# Patient Record
Sex: Male | Born: 1970 | Race: Black or African American | Hispanic: No | Marital: Married | Smoking: Never smoker
Health system: Southern US, Community
[De-identification: ages and names within clinical notes are randomized; demographics above are authoritative.]

## PROBLEM LIST (undated history)

## (undated) HISTORY — PX: KNEE SURGERY: SHX244

---

## 2009-02-05 ENCOUNTER — Emergency Department (HOSPITAL_COMMUNITY): Admission: EM | Admit: 2009-02-05 | Discharge: 2009-02-05 | Payer: Self-pay | Admitting: Emergency Medicine

## 2009-02-08 ENCOUNTER — Encounter: Admission: RE | Admit: 2009-02-08 | Discharge: 2009-02-08 | Payer: Self-pay | Admitting: Orthopedic Surgery

## 2009-02-09 ENCOUNTER — Ambulatory Visit (HOSPITAL_COMMUNITY): Admission: RE | Admit: 2009-02-09 | Discharge: 2009-02-10 | Payer: Self-pay | Admitting: Orthopedic Surgery

## 2009-08-04 ENCOUNTER — Emergency Department (HOSPITAL_COMMUNITY): Admission: EM | Admit: 2009-08-04 | Discharge: 2009-08-04 | Payer: Self-pay | Admitting: Emergency Medicine

## 2010-08-19 LAB — POCT I-STAT, CHEM 8
BUN: 12 mg/dL (ref 6–23)
Chloride: 104 mEq/L (ref 96–112)
Creatinine, Ser: 1 mg/dL (ref 0.4–1.5)
Glucose, Bld: 90 mg/dL (ref 70–99)
Hemoglobin: 15 g/dL (ref 13.0–17.0)
Sodium: 138 mEq/L (ref 135–145)

## 2010-08-19 LAB — CBC
Hemoglobin: 14.1 g/dL (ref 13.0–17.0)
MCHC: 33.2 g/dL (ref 30.0–36.0)
MCV: 85.4 fL (ref 78.0–100.0)
Platelets: 333 10*3/uL (ref 150–400)
RDW: 15 % (ref 11.5–15.5)

## 2010-08-19 LAB — POCT CARDIAC MARKERS
CKMB, poc: <1 ng/mL — ABNORMAL LOW (ref 1.0–8.0)
Myoglobin, poc: 101 ng/mL (ref 12–200)
Myoglobin, poc: 71.6 ng/mL (ref 12–200)
Troponin i, poc: <0.05 ng/mL (ref 0.00–0.09)
Troponin i, poc: <0.05 ng/mL (ref 0.00–0.09)

## 2010-08-30 LAB — CBC
HCT: 44.9 % (ref 39.0–52.0)
Hemoglobin: 14.8 g/dL (ref 13.0–17.0)
RBC: 5.2 MIL/uL (ref 4.22–5.81)
RDW: 14.7 % (ref 11.5–15.5)

## 2010-08-30 LAB — URINALYSIS, ROUTINE W REFLEX MICROSCOPIC
Hgb urine dipstick: NEGATIVE
Ketones, ur: NEGATIVE mg/dL
Protein, ur: NEGATIVE mg/dL
Specific Gravity, Urine: 1.02 (ref 1.005–1.030)
pH: 7 (ref 5.0–8.0)

## 2010-08-30 LAB — BASIC METABOLIC PANEL
BUN: 9 mg/dL (ref 6–23)
Chloride: 107 mEq/L (ref 96–112)
Creatinine, Ser: 0.95 mg/dL (ref 0.4–1.5)
GFR calc Af Amer: >60 mL/min (ref >60)
Glucose, Bld: 99 mg/dL (ref 70–99)

## 2010-08-30 LAB — PROTIME-INR: Prothrombin Time: 11.9 seconds (ref 11.6–15.2)

## 2010-08-30 LAB — APTT: aPTT: 29 seconds (ref 24–37)

## 2010-08-30 LAB — DIFFERENTIAL
Monocytes Absolute: 0.6 10*3/uL (ref 0.1–1.0)
Neutrophils Relative %: 48 % (ref 43–77)

## 2015-02-24 ENCOUNTER — Emergency Department: Admission: EM | Admit: 2015-02-24 | Discharge: 2015-02-24 | Discharge status: Against Medical Advice | Payer: Self-pay

## 2015-02-24 ENCOUNTER — Emergency Department (INDEPENDENT_AMBULATORY_CARE_PROVIDER_SITE_OTHER)
Admission: EM | Admit: 2015-02-24 | Discharge: 2015-02-24 | Disposition: A | Discharge status: Home / Self Care | Payer: Worker's Compensation | Source: Home / Self Care | Attending: Family Medicine | Admitting: Family Medicine

## 2015-02-24 ENCOUNTER — Encounter (HOSPITAL_COMMUNITY): Payer: Self-pay | Admitting: *Deleted

## 2015-02-24 DIAGNOSIS — S61451A Open bite of right hand, initial encounter: Secondary | ICD-10-CM | POA: Diagnosis not present

## 2015-02-24 DIAGNOSIS — W540XXA Bitten by dog, initial encounter: Secondary | ICD-10-CM

## 2015-02-24 MED ORDER — TETANUS-DIPHTH-ACELL PERTUSSIS 5-2.5-18.5 LF-MCG/0.5 IM SUSP
INTRAMUSCULAR | Status: AC
  Filled 2015-02-24: qty 0.5

## 2015-02-24 MED ORDER — TETANUS-DIPHTH-ACELL PERTUSSIS 5-2.5-18.5 LF-MCG/0.5 IM SUSP
0.5000 mL | Freq: Once | INTRAMUSCULAR | Status: AC
  Administered 2015-02-24: 0.5 mL via INTRAMUSCULAR

## 2015-02-24 NOTE — ED Notes (Signed)
Report  Faxed to  Animal control

## 2015-02-24 NOTE — Discharge Instructions (Signed)
Wash regularly, you had a tetanus booster, return if any problems.

## 2015-02-24 NOTE — ED Notes (Signed)
Pt   Reports      He   Was  Bitten       By    A  Dog   Today  While  At  Work   He  Has  A  Break in  Skin noted   He  States  The  Dog     Is  Utd       On rabies  Shot  But the  Pt is  Late  On his

## 2015-02-24 NOTE — ED Provider Notes (Addendum)
CSN: [id number]     Arrival date & time 02/24/15  1826 History   First MD Initiated Contact with Patient 02/24/15 1958     Chief Complaint  Patient presents with  . Animal Bite   (Consider location/radiation/quality/duration/timing/severity/associated sxs/prior Treatment) Patient is a 44 y.o. male presenting with animal bite. The history is provided by the patient.  Animal Bite Contact animal:  Dog Location:  Hand Hand injury location:  [place] of R hand Time since incident:  2 hours Pain details:    Quality:  Sore   Severity:  Mild   Progression:  Unchanged Provoked: provoked   Notifications:  Animal control Animal's rabies vaccination status:  Up to date Animal in possession: yes   Tetanus status:  Out of date Relieved by:  None tried Ineffective treatments:  None tried Associated symptoms: swelling   Associated symptoms: no numbness     History reviewed. No pertinent past medical history. History reviewed. No pertinent past surgical history. History reviewed. No pertinent family history. Social History  Substance Use Topics  . Smoking status: Never Smoker   . Smokeless tobacco: None  . Alcohol Use: No    Review of Systems  Musculoskeletal: Negative.   Skin: Positive for color change and wound.  Neurological: Negative for numbness.  All other systems reviewed and are negative.   Allergies  Review of patient's allergies indicates no known allergies.  Home Medications   Prior to Admission medications   Not on File   Meds Ordered and Administered this Visit   Medications  Tdap (BOOSTRIX) injection 0.5 mL (not administered)    BP 120/70 mmHg  Pulse 78  Temp(Src) 98.6 F (37 C) (Oral)  Resp 18  SpO2 100% No data found.   Physical Exam  Constitutional: He is oriented to person, place, and time. He appears well-developed and well-nourished.  Musculoskeletal: He exhibits tenderness.       Right hand: He exhibits swelling. He exhibits normal range of  motion, no tenderness, no bony tenderness, no deformity and no laceration.       Hands: Neurological: He is alert and oriented to person, place, and time.  Skin: Skin is warm and dry.  Nursing note and vitals reviewed.   ED Course  Procedures (including critical care time)  Labs Review Labs Reviewed - No data to display  Imaging Review No results found.   Visual Acuity Review  Right Eye Distance:   Left Eye Distance:   Bilateral Distance:    Right Eye Near:   Left Eye Near:    Bilateral Near:         MDM   1. Dog bite of right hand, initial encounter    Wound care given , tdap    Linna Hoff, MD 02/24/15 2018  Linna Hoff, MD 02/24/15 2021

## 2016-05-13 ENCOUNTER — Ambulatory Visit (INDEPENDENT_AMBULATORY_CARE_PROVIDER_SITE_OTHER): Payer: Managed Care, Other (non HMO)

## 2016-05-13 ENCOUNTER — Encounter (HOSPITAL_COMMUNITY): Payer: Self-pay | Admitting: Family Medicine

## 2016-05-13 ENCOUNTER — Ambulatory Visit (HOSPITAL_COMMUNITY)
Admission: EM | Admit: 2016-05-13 | Discharge: 2016-05-13 | Disposition: A | Discharge status: Home / Self Care | Payer: Managed Care, Other (non HMO) | Attending: Family Medicine | Admitting: Family Medicine

## 2016-05-13 DIAGNOSIS — M79641 Pain in right hand: Secondary | ICD-10-CM | POA: Diagnosis not present

## 2016-05-13 MED ORDER — INDOMETHACIN ER 75 MG PO CPCR: ORAL_CAPSULE | ORAL | 0 refills | Status: DC

## 2016-05-13 NOTE — ED Triage Notes (Signed)
Pt here for right hand pain and swelling. No known injury.

## 2016-05-13 NOTE — ED Provider Notes (Signed)
CSN: [id number]     Arrival date & time 05/13/16  1630 History   First MD Initiated Contact with Patient 05/13/16 1701     Chief Complaint  Patient presents with  . Hand Pain   (Consider location/radiation/quality/duration/timing/severity/associated sxs/prior Treatment) Patient awoke with moderate to severe right hand pain.  He denies any trauma or overuse type injury.  He states he awoke around 0400 and noticed the pain which has become severe.   The history is provided by the patient.  Hand Pain  This is a new problem. The current episode started 12 to 24 hours ago. The problem occurs constantly. The problem has not changed since onset.Nothing aggravates the symptoms. Nothing relieves the symptoms. He has tried nothing for the symptoms.    History reviewed. No pertinent past medical history. History reviewed. No pertinent surgical history. History reviewed. No pertinent family history. Social History  Substance Use Topics  . Smoking status: Never Smoker  . Smokeless tobacco: Never Used  . Alcohol use No    Review of Systems  Constitutional: Negative.   HENT: Negative.   Eyes: Negative.   Respiratory: Negative.   Cardiovascular: Negative.   Gastrointestinal: Negative.   Endocrine: Negative.   Genitourinary: Negative.   Musculoskeletal: Positive for arthralgias.  Allergic/Immunologic: Negative.   Neurological: Negative.   Hematological: Negative.   Psychiatric/Behavioral: Negative.     Allergies  Patient has no known allergies.  Home Medications   Prior to Admission medications   Medication Sig Start Date End Date Taking? Authorizing Provider  indomethacin (INDOCIN SR) 75 MG CR capsule One po bid prn pain 05/13/16   Deatra CanterWilliam J Orvill Coulthard, FNP   Meds Ordered and Administered this Visit  Medications - No data to display  BP 144/88   Pulse 85   Temp 98.2 F (36.8 C)   Resp 18   SpO2 98%  No data found.   Physical Exam  Constitutional: He appears well-developed  and well-nourished.  HENT:  Head: Normocephalic and atraumatic.  Right Ear: External ear normal.  Left Ear: External ear normal.  Mouth/Throat: Oropharynx is clear and moist.  Eyes: Conjunctivae and EOM are normal. Pupils are equal, round, and reactive to light.  Neck: Normal range of motion. Neck supple.  Cardiovascular: Normal rate, regular rhythm and normal heart sounds.   Pulmonary/Chest: Effort normal.  Abdominal: Soft. Bowel sounds are normal.  Musculoskeletal: He exhibits tenderness.  Right 5th metacarpal with tenderness and decreased ROM.  No swelling or deformity.  Nursing note and vitals reviewed.   Urgent Care Course   Clinical Course     Procedures (including critical care time)  Labs Review Labs Reviewed - No data to display  Imaging Review Dg Hand Complete Right  Result Date: 05/13/2016 CLINICAL DATA:  Pain in the right hand with swelling EXAM: RIGHT HAND - COMPLETE 3+ VIEW COMPARISON:  None. FINDINGS: There is no evidence of fracture or dislocation. There is no evidence of arthropathy or other focal bone abnormality. Soft tissues are unremarkable. IMPRESSION: Negative. Electronically Signed   By: Jasmine PangKim  Fujinaga M.D.   On: 05/13/2016 17:05     Visual Acuity Review  Right Eye Distance:   Left Eye Distance:   Bilateral Distance:    Right Eye Near:   Left Eye Near:    Bilateral Near:         MDM   1. Right hand pain    Indomethacin 75mg  one po bid prn pain #20      Anselm PancoastWilliam J  ChillumOxford, FNP 05/13/16 1717

## 2017-03-28 ENCOUNTER — Ambulatory Visit (HOSPITAL_COMMUNITY)
Admission: EM | Admit: 2017-03-28 | Discharge: 2017-03-28 | Disposition: A | Discharge status: Home / Self Care | Payer: Managed Care, Other (non HMO) | Attending: Family Medicine | Admitting: Family Medicine

## 2017-03-28 ENCOUNTER — Ambulatory Visit (INDEPENDENT_AMBULATORY_CARE_PROVIDER_SITE_OTHER): Payer: Managed Care, Other (non HMO)

## 2017-03-28 ENCOUNTER — Encounter (HOSPITAL_COMMUNITY): Payer: Self-pay | Admitting: Emergency Medicine

## 2017-03-28 DIAGNOSIS — M25571 Pain in right ankle and joints of right foot: Secondary | ICD-10-CM

## 2017-03-28 NOTE — ED Triage Notes (Signed)
Dropped a weight on ankle one week ago.  Abrasion to right lateral ankle

## 2017-03-30 NOTE — ED Provider Notes (Signed)
  [facility]   [id number] 03/28/17 Arrival Time: 1428  ASSESSMENT & PLAN:  1. Right ankle pain, unspecified chronicity    Prefers OTC ibuprofen and rest. May f/u as needed. No fractures seen on x-rays today.  Reviewed expectations re: course of current medical issues. Questions answered. Outlined signs and symptoms indicating need for more acute intervention. Patient verbalized understanding. After Visit Summary given.   SUBJECTIVE:  Jeremy Pace is a 46 y.o. male who reports:  Ankle Pain: Patient complains of right ankle pain.  Onset of the symptoms was a week ago after dropping a weight on his R lateral foot. Current symptoms include occasional swelling and discomfrot of R lateral ankle. Aggravating symptoms: certain movements; prolonged standing. Patient's overall course: stable. Patient has had no prior ankle problems. Previous visits for this problem: none.  Evaluation to date: none.  Treatment to date: none. No extremity sensation changes or weakness.   OBJECTIVE:  Vitals:   03/28/17 1440  BP: 127/74  Pulse: (!) 55  Resp: 20  Temp: 98.2 F (36.8 C)  TempSrc: Oral  SpO2: 99%    General appearance: alert; no distress Extremities: no cyanosis or edema; symmetrical with no gross deformities; tenderness over his R lateral ankle (poorly localized) with mild swelling and no bruising; FROM of R ankle CV: normal extremity capillary refill Skin: warm and dry Neurologic: normal gait; normal symmetric reflexes in all extremities; normal sensation Psychological: alert and cooperative; normal mood and affect  Imaging: Dg Ankle Complete Right  Result Date: 03/28/2017 CLINICAL DATA:  Patient with injury to the right ankle 1 week prior. Injury to the lateral malleolus. EXAM: RIGHT ANKLE - COMPLETE 3+ VIEW COMPARISON:  None. FINDINGS: Normal anatomic alignment. No evidence for acute fracture or dislocation. Talar dome is intact. Regional soft tissues unremarkable.  IMPRESSION: No acute osseous abnormality. Electronically Signed   By: Annia Beltrew  Davis M.D.   On: 03/28/2017 15:50    No Known Allergies   Social History   Socioeconomic History  . Marital status: Married    Spouse name: Not on file  . Number of children: Not on file  . Years of education: Not on file  . Highest education level: Not on file  Social Needs  . Financial resource strain: Not on file  . Food insecurity - worry: Not on file  . Food insecurity - inability: Not on file  . Transportation needs - medical: Not on file  . Transportation needs - non-medical: Not on file  Occupational History  . Not on file  Tobacco Use  . Smoking status: Never Smoker  . Smokeless tobacco: Never Used  Substance and Sexual Activity  . Alcohol use: No  . Drug use: No  . Sexual activity: Not on file  Other Topics Concern  . Not on file  Social History Narrative  . Not on file   No family history on file. Past Surgical History:  Procedure Laterality Date  . KNEE SURGERY       Mardella LaymanHagler, Haifa Hatton, MD 03/30/17 [phone]

## 2018-01-11 IMAGING — DX DG ANKLE COMPLETE 3+V*R*
3 series · 3 of 3 positions shown · non-contrast
Comparison: None.

CLINICAL DATA: Patient with injury to the right ankle 1 week prior.
Injury to the lateral malleolus.

EXAM:
RIGHT ANKLE - COMPLETE 3+ VIEW

[ankle ap]
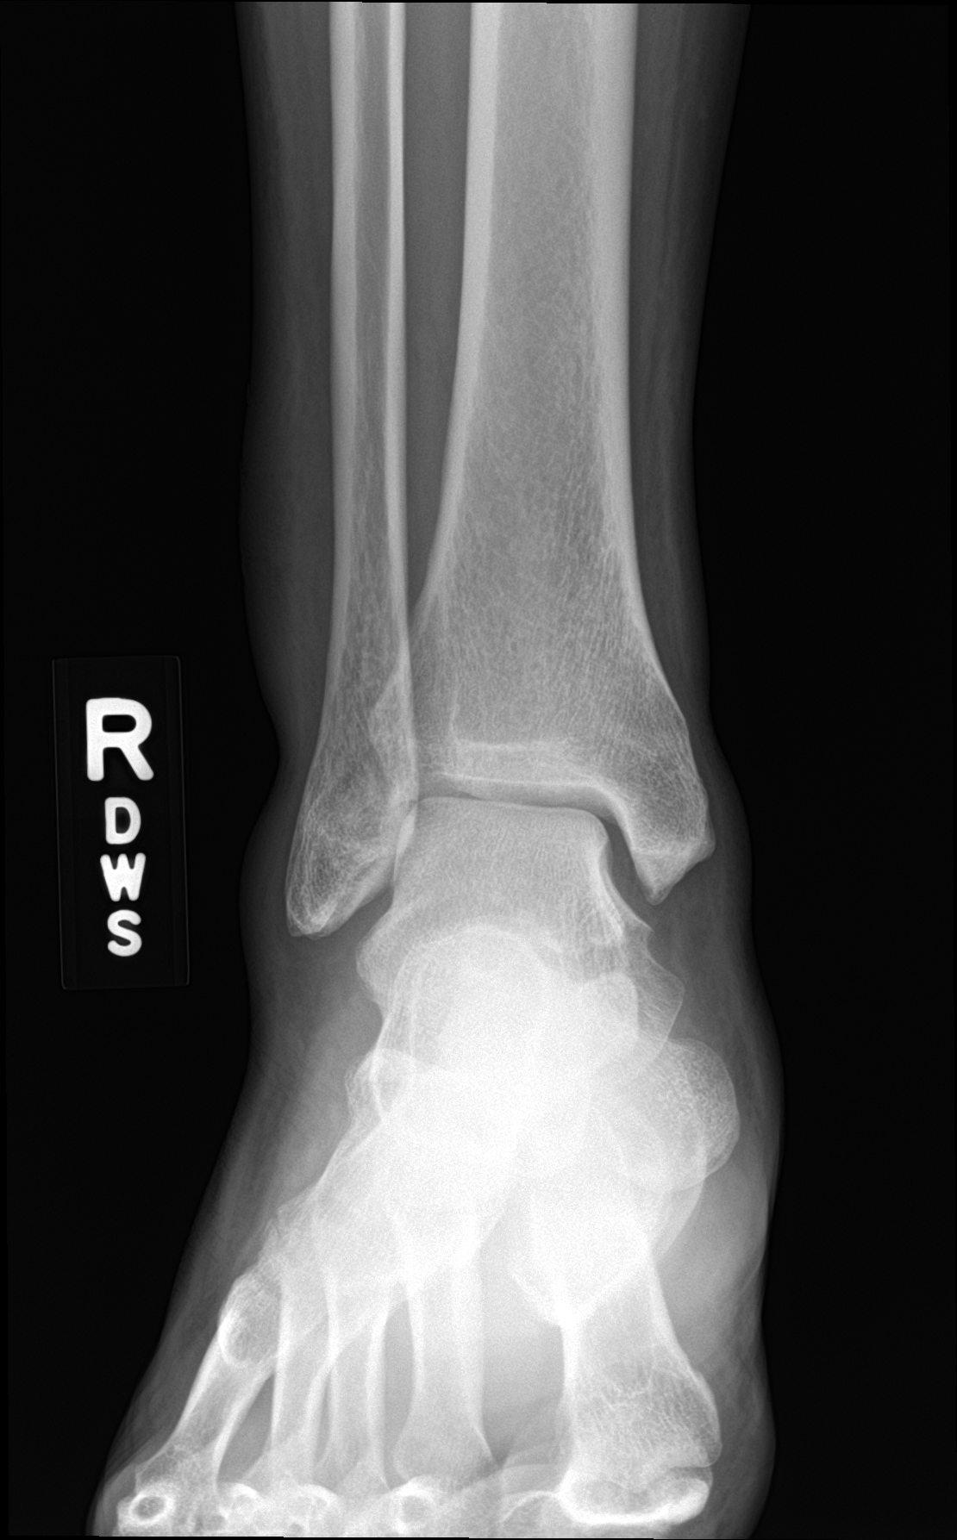

[ankle obl]
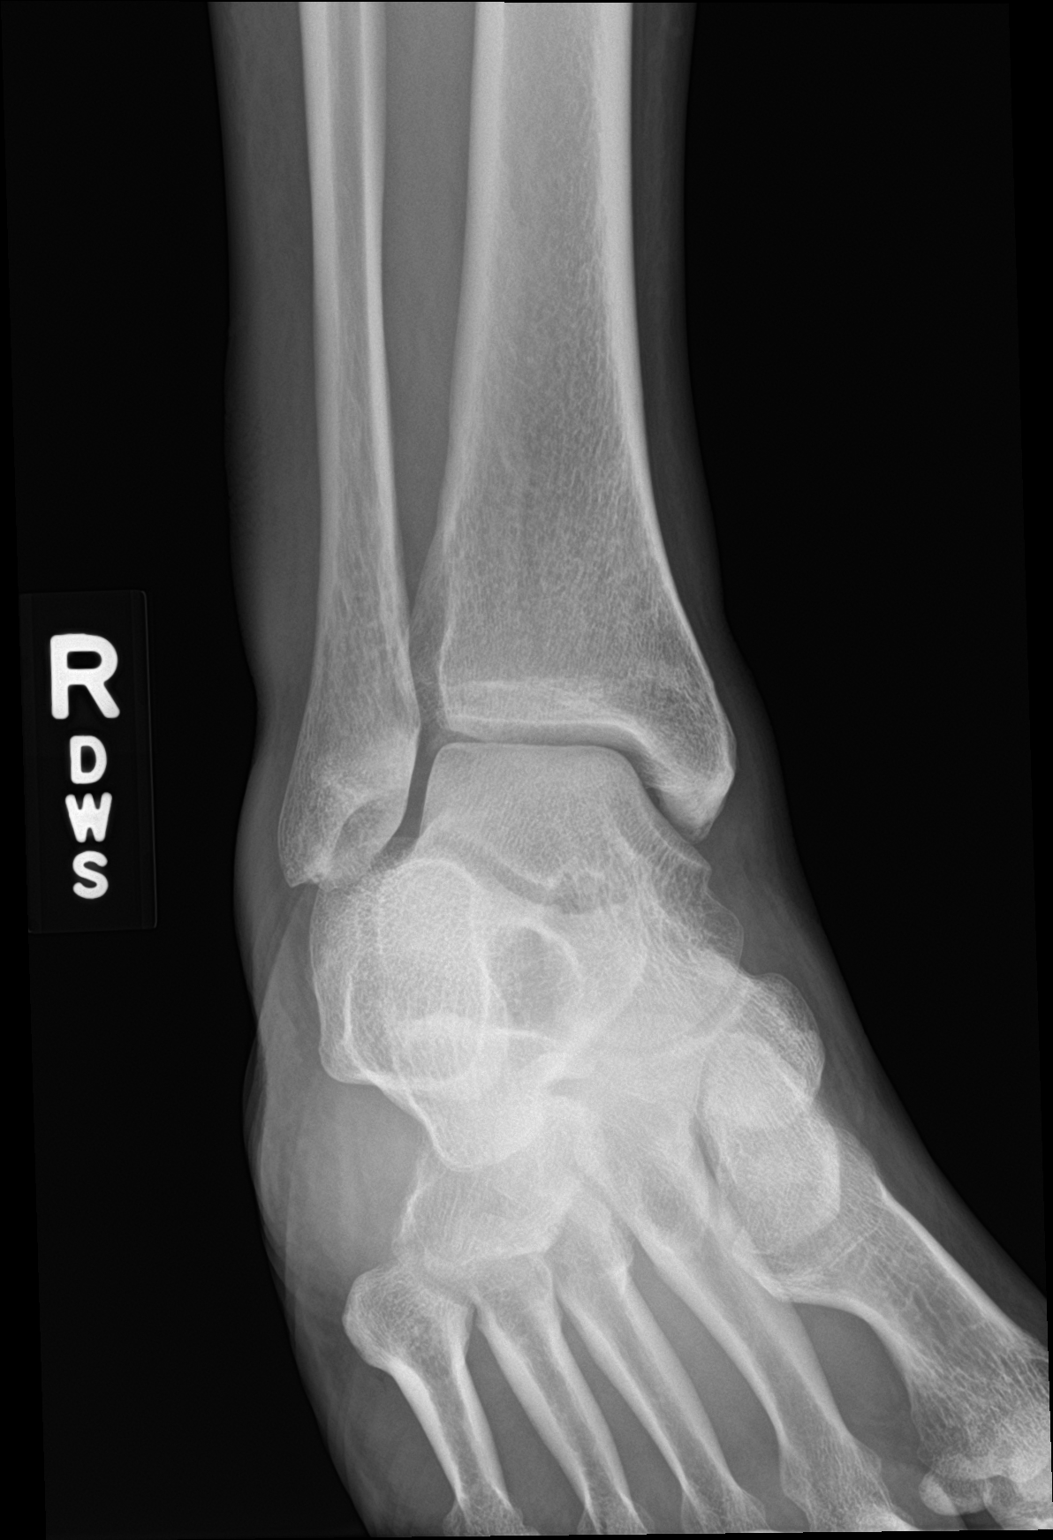

[ankle lat]
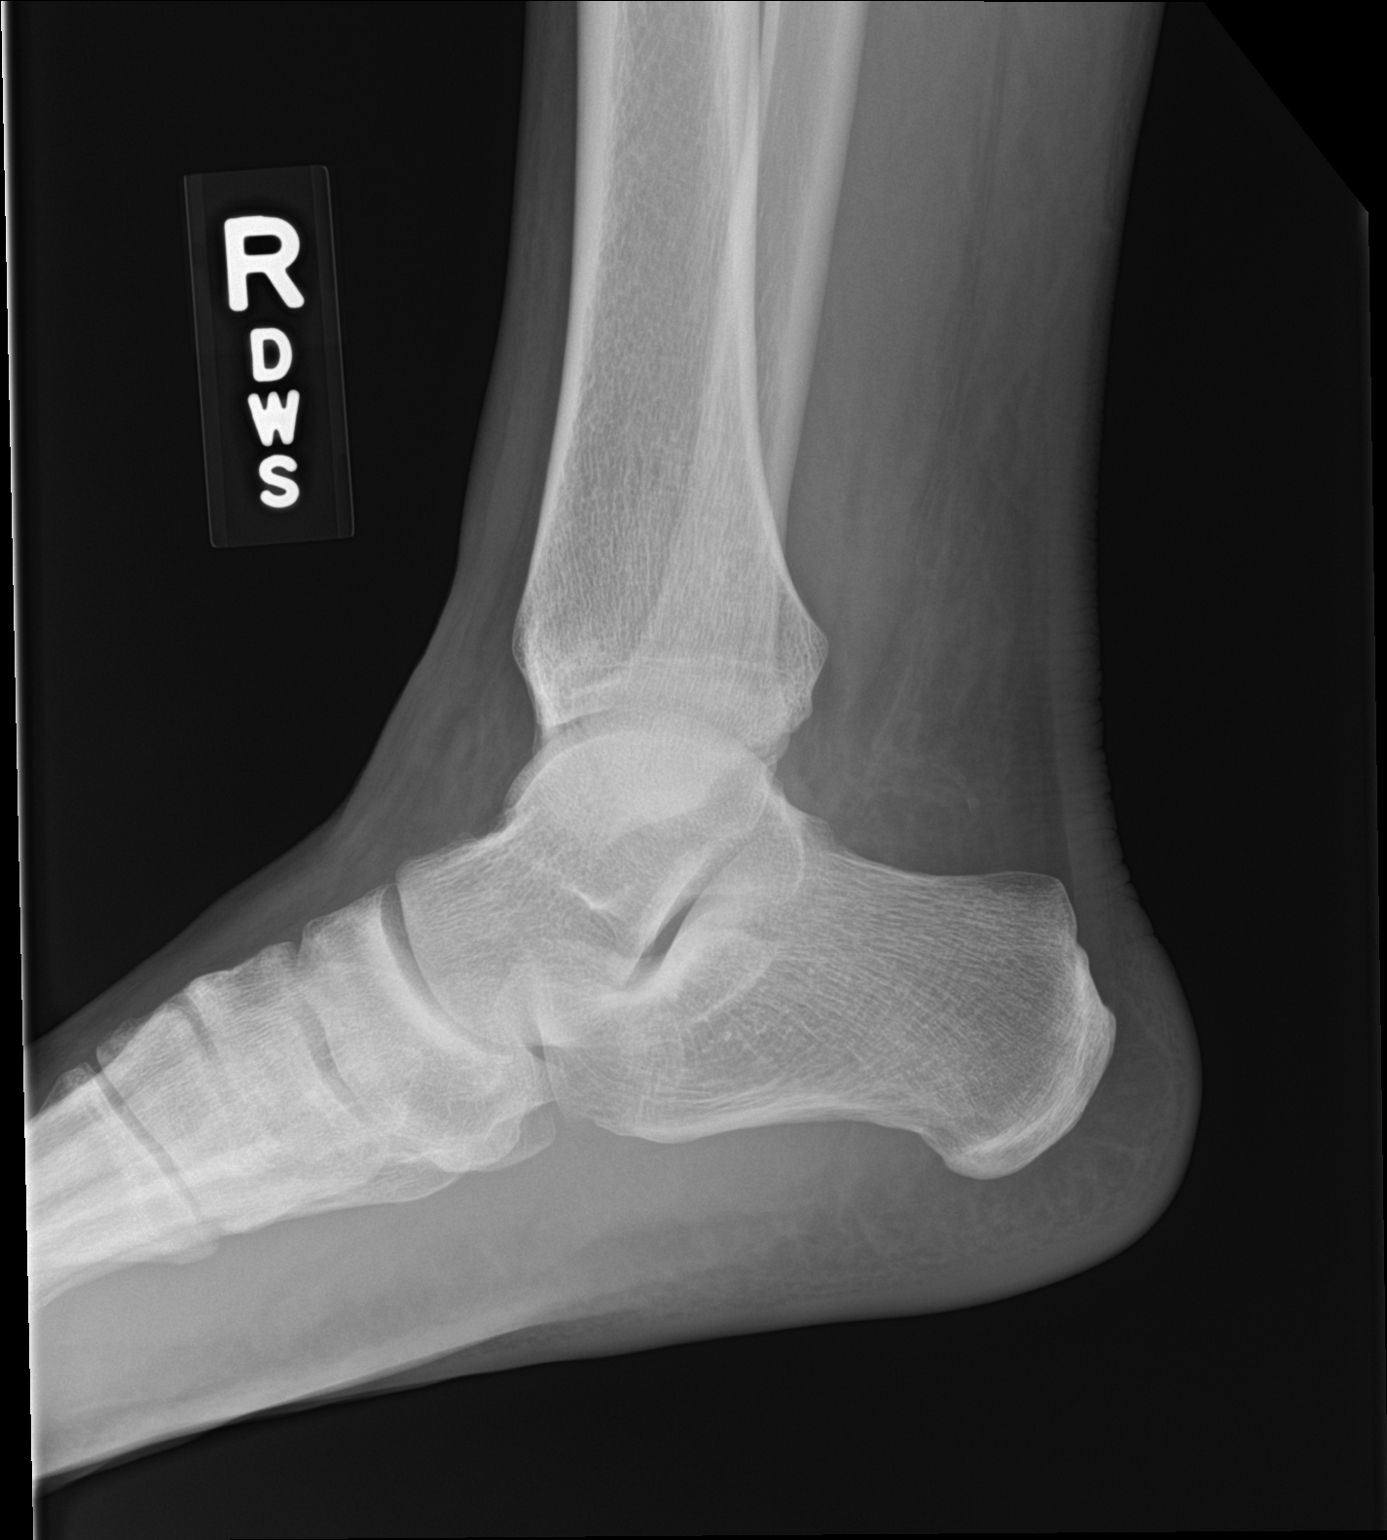

[3 of 3 positions shown; findings below may reference images not displayed]

FINDINGS: Normal anatomic alignment. No evidence for acute fracture or
dislocation. Talar dome is intact. Regional soft tissues
unremarkable.
IMPRESSION: No acute osseous abnormality.
